# Patient Record
Sex: Female | Born: 1985 | Race: Black or African American | Hispanic: No | Marital: Single | Smoking: Former smoker
Health system: Southern US, Community
[De-identification: ages and names within clinical notes are randomized; demographics above are authoritative.]

## PROBLEM LIST (undated history)

## (undated) ENCOUNTER — Inpatient Hospital Stay (HOSPITAL_COMMUNITY): Payer: Self-pay

## (undated) DIAGNOSIS — Z789 Other specified health status: Secondary | ICD-10-CM

## (undated) HISTORY — PX: DILATION AND CURETTAGE OF UTERUS: SHX78

---
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
## 2007-02-01 ENCOUNTER — Ambulatory Visit (HOSPITAL_COMMUNITY): Admission: RE | Admit: 2007-02-01 | Discharge: 2007-02-01 | Payer: Self-pay | Admitting: Obstetrics & Gynecology

## 2007-02-15 ENCOUNTER — Ambulatory Visit (HOSPITAL_COMMUNITY): Admission: RE | Admit: 2007-02-15 | Discharge: 2007-02-15 | Payer: Self-pay | Admitting: Obstetrics & Gynecology

## 2007-07-14 ENCOUNTER — Inpatient Hospital Stay (HOSPITAL_COMMUNITY): Admission: AD | Admit: 2007-07-14 | Discharge: 2007-07-16 | Payer: Self-pay | Admitting: Obstetrics & Gynecology

## 2007-07-22 ENCOUNTER — Inpatient Hospital Stay (HOSPITAL_COMMUNITY): Admission: AD | Admit: 2007-07-22 | Discharge: 2007-07-25 | Payer: Self-pay | Admitting: Obstetrics & Gynecology

## 2008-08-04 ENCOUNTER — Emergency Department (HOSPITAL_COMMUNITY): Admission: EM | Admit: 2008-08-04 | Discharge: 2008-08-04 | Payer: Self-pay | Admitting: Family Medicine

## 2009-05-27 ENCOUNTER — Emergency Department (HOSPITAL_COMMUNITY): Admission: EM | Admit: 2009-05-27 | Discharge: 2009-05-27 | Payer: Self-pay | Admitting: Emergency Medicine

## 2010-08-28 ENCOUNTER — Encounter: Payer: Self-pay | Admitting: Obstetrics & Gynecology

## 2010-11-10 LAB — GC/CHLAMYDIA PROBE AMP, GENITAL: Chlamydia, DNA Probe: NEGATIVE

## 2010-11-10 LAB — URINALYSIS, ROUTINE W REFLEX MICROSCOPIC
Hgb urine dipstick: NEGATIVE
Nitrite: POSITIVE — AB
Protein, ur: NEGATIVE mg/dL
Specific Gravity, Urine: 1.035 — ABNORMAL HIGH (ref 1.005–1.030)
Urobilinogen, UA: 1 mg/dL (ref 0.0–1.0)

## 2010-11-10 LAB — DIFFERENTIAL
Basophils Absolute: 0 10*3/uL (ref 0.0–0.1)
Eosinophils Absolute: 0 10*3/uL (ref 0.0–0.7)
Eosinophils Relative: 1 % (ref 0–5)
Lymphocytes Relative: 33 % (ref 12–46)
Monocytes Absolute: 0.4 10*3/uL (ref 0.1–1.0)

## 2010-11-10 LAB — URINE CULTURE

## 2010-11-10 LAB — CBC
HCT: 40 % (ref 36.0–46.0)
Hemoglobin: 13.7 g/dL (ref 12.0–15.0)
MCHC: 34.2 g/dL (ref 30.0–36.0)
MCV: 95.3 fL (ref 78.0–100.0)
Platelets: 260 10*3/uL (ref 150–400)
RDW: 12.9 % (ref 11.5–15.5)

## 2010-11-10 LAB — HCG, QUANTITATIVE, PREGNANCY: hCG, Beta Chain, Quant, S: 67506 m[IU]/mL — ABNORMAL HIGH (ref <5)

## 2010-11-10 LAB — URINE MICROSCOPIC-ADD ON

## 2010-11-10 LAB — WET PREP, GENITAL
Trich, Wet Prep: NONE SEEN
Yeast Wet Prep HPF POC: NONE SEEN

## 2010-12-20 NOTE — H&P (Signed)
Mandy Robinson, Mandy Robinson NO.:  [id number]   MEDICAL RECORD NO.:  [id number]          PATIENT TYPE:  INP   LOCATION:  9110                          FACILITY:  [facility]   PHYSICIAN:  Roseanna Rainbow, M.D.DATE OF BIRTH:  09-06-1985   DATE OF ADMISSION:  07/14/2007  DATE OF DISCHARGE:                              HISTORY & PHYSICAL   CHIEF COMPLAINT:  The patient an 25 year old, G1, P0 with an estimated  date of confinement of December 6, with an intrauterine pregnancy of 40  plus weeks complaining of uterine contractions.   HISTORY OF PRESENT ILLNESS:  Please see the above.   OB RISK FACTORS:  Adolescent urinary tract infections.   ALLERGIES:  No known drug allergies.   PRENATAL LABORATORY DATA:  Quad screen negative.  Cystic fibrosis  negative.  GC probe negative.  Chlamydia probe negative.  Urine culture  and sensitivity on  Dec 20, 2006, no growth.  On May 5, greater than  100,000 E. coli, sickle cell negative, rubella immune, RPR nonreactive.  Blood type A positive, antibody screen negative.  Platelets 249,000.  HIV nonreactive.  Hemoglobin 13.2, hematocrit 38.7.  Varicella immune.  GBS negative on October 27.   PAST GYNECOLOGICAL HISTORY:  Noncontributory.   PAST MEDICAL HISTORY:  No significant history of medical diseases.   PAST SURGICAL HISTORY:  No previous surgery.   SOCIAL HISTORY:  She is single and does not give any significant history  of alcohol usage.  Stopped using cigarettes prior to pregnancy.  Denies  illicit drug use.   FAMILY HISTORY:  Breast cancer, hypertension, COPD, cataracts,  arthritis.   PHYSICAL EXAMINATION:  VITAL SIGNS:  Stable, afebrile.  ABDOMEN:  Fetal heart tracing reassuring.  Tocodynamometer uterine  contractions every 2-5 minutes.  PELVIC:  Sterile vaginal exam, per the RN, shows the cervix is 5-6 cm,  80% effaced with vertex at a -2 station.   ASSESSMENT:  Primigravida at term, early active labor.  Fetal heart  tracing consistent with fetal well-being.   PLAN:  Admission.  Expectant management.      Roseanna Rainbow, M.D.  Electronically Signed     LAJ/MEDQ  D:  07/14/2007  T:  07/15/2007  Job:  [id number]

## 2010-12-23 NOTE — Discharge Summary (Signed)
NAMEMarland Kitchen  Mandy Robinson, Mandy Robinson             ACCOUNT NO.:  [id number]   MEDICAL RECORD NO.:  [id number]          PATIENT TYPE:  INP   LOCATION:  9372                          FACILITY:  [facility]   PHYSICIAN:  Charles A. Clearance Coots, M.D.DATE OF BIRTH:  [birth year]   DATE OF ADMISSION:  07/22/2007  DATE OF DISCHARGE:  07/25/2007                               DISCHARGE SUMMARY   ADMITTING DIAGNOSIS:  Postpartum preeclampsia   DISCHARGE DIAGNOSIS:  Postpartum preeclampsia, resolved after medical  therapy and bedrest.   REASON FOR ADMISSION:  This 25 year old female  presented on postpartum  day #6, approximately, with elevated blood pressures that were taken at  home. The patient had an uncomplicated delivery.  Blood pressure on  admission was 173/114 and repeat blood pressure was 172/105. The patient  was admitted for postpartum preeclampsia.   PAST MEDICAL HISTORY:  Surgery, none.  Illnesses, none.   MEDICATIONS:  Prenatal vitamins.   ALLERGIES:  None   SOCIAL HISTORY:  Lives with mother.  Negative tobacco, alcohol or  recreational drug use.   FAMILY HISTORY:  Positive for cancer and hypertension.   PHYSICAL EXAMINATION:  GENERAL:  Well-nourished, well-developed female  in no acute distress.  VITAL SIGNS: Temperature 98.5, blood pressure 173/114, pulse 62,  respiratory rate 18.  LUNGS:  Lungs were clear to auscultation bilaterally.  HEART:  Regular rate and rhythm.  ABDOMEN: Gravid, nontender.  EXTREMITIES:  Extremities revealed deep tendon reflexes of 1-2+ with no  clonus.   LABORATORY DATA:  Admitting laboratory values -  Hemoglobin 13.8,  hematocrit 39.1, white blood cell count 6100, platelets 375,000.  Comprehensive metabolic panel was significant for ALT of 43 and AST of  14, uric acid was 7.6.   HOSPITAL COURSE:  The patient was admitted and started on IV magnesium  sulfate along with labetalol p.r.n. She responded well to therapy and by  hospital day #2 had stable blood pressures.  The patient was discharged  home on hospital day #3 with stable blood pressures on p.o. labetalol  with blood pressures to be followed up at home and in the office   DISCHARGE DISPOSITION:  Medications - Labetalol  100 mg p.o. twice a  day, Norvasc 5 mg p.o. daily. Continue prenatal vitamins.  Ibuprofen was  prescribed for pain. Routine written instructions were given for  preeclampsia precautions.  The patient is to call the office for a  follow-up appointment in 2 weeks.      Charles A. Clearance Coots, M.D.  Electronically Signed     CAH/MEDQ  D:  08/22/2007  T:  08/22/2007  Job:  [id number]

## 2011-05-12 LAB — COMPREHENSIVE METABOLIC PANEL
CO2: 24
Calcium: 8.9
Creatinine, Ser: 0.75
GFR calc Af Amer: >60
GFR calc non Af Amer: >60
Glucose, Bld: 86
Total Protein: 6.5

## 2011-05-12 LAB — CBC
Hemoglobin: 13.8
MCHC: 35.3
MCV: 95.7
RBC: 4.09
RDW: 12.5

## 2011-05-12 LAB — URINALYSIS, ROUTINE W REFLEX MICROSCOPIC
Nitrite: NEGATIVE
Protein, ur: NEGATIVE
Specific Gravity, Urine: <1.005 — ABNORMAL LOW
Urobilinogen, UA: 0.2

## 2011-05-12 LAB — LACTATE DEHYDROGENASE: LDH: 284 — ABNORMAL HIGH

## 2011-05-12 LAB — URINE MICROSCOPIC-ADD ON

## 2011-05-15 LAB — CBC
HCT: 37.8
Hemoglobin: 10.6 — ABNORMAL LOW
MCHC: 35.3
MCHC: 35.5
MCV: 96.1
Platelets: 195
Platelets: 230
RDW: 12.7
RDW: 13.1

## 2011-09-04 ENCOUNTER — Encounter (HOSPITAL_COMMUNITY): Payer: Self-pay | Admitting: *Deleted

## 2011-09-04 ENCOUNTER — Inpatient Hospital Stay (HOSPITAL_COMMUNITY)
Admission: AD | Admit: 2011-09-04 | Discharge: 2011-09-04 | Disposition: A | Discharge status: Home / Self Care | Payer: Medicaid Other | Source: Ambulatory Visit | Attending: Obstetrics & Gynecology | Admitting: Obstetrics & Gynecology

## 2011-09-04 DIAGNOSIS — O2390 Unspecified genitourinary tract infection in pregnancy, unspecified trimester: Secondary | ICD-10-CM | POA: Insufficient documentation

## 2011-09-04 DIAGNOSIS — Z3200 Encounter for pregnancy test, result unknown: Secondary | ICD-10-CM

## 2011-09-04 DIAGNOSIS — B9689 Other specified bacterial agents as the cause of diseases classified elsewhere: Secondary | ICD-10-CM | POA: Insufficient documentation

## 2011-09-04 DIAGNOSIS — N949 Unspecified condition associated with female genital organs and menstrual cycle: Secondary | ICD-10-CM | POA: Insufficient documentation

## 2011-09-04 DIAGNOSIS — N760 Acute vaginitis: Secondary | ICD-10-CM | POA: Insufficient documentation

## 2011-09-04 DIAGNOSIS — A499 Bacterial infection, unspecified: Secondary | ICD-10-CM | POA: Insufficient documentation

## 2011-09-04 HISTORY — DX: Other specified health status: Z78.9

## 2011-09-04 LAB — POCT PREGNANCY, URINE: Preg Test, Ur: POSITIVE — AB

## 2011-09-04 LAB — WET PREP, GENITAL

## 2011-09-04 MED ORDER — METRONIDAZOLE 500 MG PO TABS: 500.0000 mg | ORAL_TABLET | Freq: Two times a day (BID) | ORAL | Status: AC

## 2011-09-04 NOTE — Progress Notes (Signed)
Patient states she has had 3 positive home pregnancy tests over the past 2 weeks. Wants confirmation. Has a slight discharge, no pain or bleeding.

## 2011-09-04 NOTE — Progress Notes (Signed)
Pt in to confirm pregnancy.  Denies any pain or bleeding.  Reports a clear, odorous discharge that started after period.  LMP 08/06/11.  3 + UPT's at home

## 2011-09-04 NOTE — ED Provider Notes (Signed)
History     Chief Complaint  Patient presents with  . Possible Pregnancy   HPI This is a 26 y.o. at 4.1weeks presents requesting a pregnancy verification letter. ALso c/o vaginal discharge with odor. Has frequent episodes, but admits to douching OB History    Grav Para Term Preterm Abortions TAB SAB Ect Mult Living   3 1 1  0 1 1 0 0 0 1      Past Medical History  Diagnosis Date  . No pertinent past medical history     Past Surgical History  Procedure Date  . Dilation and curettage of uterus     History reviewed. No pertinent family history.  History  Substance Use Topics  . Smoking status: Current Everyday Smoker -- 0.1 packs/day    Types: Cigarettes  . Smokeless tobacco: Not on file  . Alcohol Use: No    Allergies: No Known Allergies  Prescriptions prior to admission  Medication Sig Dispense Refill  . hydrocortisone cream 1 % Apply 1 application topically 2 (two) times daily as needed. For eczema        ROS As above Physical Exam   Blood pressure 109/63, pulse 82, temperature 98.9 F (37.2 C), temperature source Oral, resp. rate 16, last menstrual period 08/06/2011, SpO2 98.00%.  Physical Exam  Constitutional: She is oriented to person, place, and time. She appears well-developed and well-nourished. No distress.  HENT:  Head: Normocephalic.  Cardiovascular: Normal rate.   Respiratory: Effort normal.  GI: Soft.  Genitourinary: Uterus normal. Vaginal discharge found.       Uterus small, nontender  Musculoskeletal: Normal range of motion.  Neurological: She is alert and oriented to person, place, and time.  Skin: Skin is warm and dry.  Psychiatric: She has a normal mood and affect.   Results for orders placed during the hospital encounter of 09/04/11 (from the past 24 hour(s))  POCT PREGNANCY, URINE     Status: Abnormal   Collection Time   09/04/11 10:02 AM      Component Value Range   Preg Test, Ur POSITIVE (*) NEGATIVE   WET PREP, GENITAL      Status: Abnormal   Collection Time   09/04/11 10:30 AM      Component Value Range   Yeast, Wet Prep NONE SEEN  NONE SEEN    Trich, Wet Prep NONE SEEN  NONE SEEN    Clue Cells, Wet Prep MODERATE (*) NONE SEEN    WBC, Wet Prep HPF POC FEW (*) NONE SEEN      MAU Course  Procedures  Assessment and Plan  A:  Preg at 4.1weeks      BV P:  Pregnancy verif. Letter given      Rx Flagyl      Start [facility]  [facility] 09/04/2011, 10:52 AM

## 2011-09-05 LAB — GC/CHLAMYDIA PROBE AMP, GENITAL: GC Probe Amp, Genital: NEGATIVE

## 2011-09-20 LAB — OB RESULTS CONSOLE RPR: RPR: NONREACTIVE

## 2011-10-02 LAB — OB RESULTS CONSOLE HEPATITIS B SURFACE ANTIGEN: Hepatitis B Surface Ag: NEGATIVE

## 2011-10-02 LAB — OB RESULTS CONSOLE HIV ANTIBODY (ROUTINE TESTING): HIV: NONREACTIVE

## 2012-04-19 ENCOUNTER — Encounter (HOSPITAL_COMMUNITY): Payer: Self-pay | Admitting: *Deleted

## 2012-04-19 ENCOUNTER — Inpatient Hospital Stay (HOSPITAL_COMMUNITY)
Admission: AD | Admit: 2012-04-19 | Discharge: 2012-04-21 | DRG: 775 | Disposition: A | Discharge status: Home / Self Care | Payer: Medicaid Other | Source: Ambulatory Visit | Attending: Obstetrics and Gynecology | Admitting: Obstetrics and Gynecology

## 2012-04-19 ENCOUNTER — Inpatient Hospital Stay (HOSPITAL_COMMUNITY): Payer: Medicaid Other | Admitting: Anesthesiology

## 2012-04-19 ENCOUNTER — Encounter (HOSPITAL_COMMUNITY): Payer: Self-pay | Admitting: Anesthesiology

## 2012-04-19 DIAGNOSIS — Z3480 Encounter for supervision of other normal pregnancy, unspecified trimester: Secondary | ICD-10-CM

## 2012-04-19 DIAGNOSIS — O99892 Other specified diseases and conditions complicating childbirth: Principal | ICD-10-CM | POA: Diagnosis present

## 2012-04-19 DIAGNOSIS — Z22330 Carrier of Group B streptococcus: Secondary | ICD-10-CM

## 2012-04-19 LAB — OB RESULTS CONSOLE GBS: GBS: POSITIVE

## 2012-04-19 LAB — CBC
Hemoglobin: 11 g/dL — ABNORMAL LOW (ref 12.0–15.0)
MCH: 30.7 pg (ref 26.0–34.0)
MCHC: 33.5 g/dL (ref 30.0–36.0)
MCV: 91.6 fL (ref 78.0–100.0)
RBC: 3.58 MIL/uL — ABNORMAL LOW (ref 3.87–5.11)

## 2012-04-19 LAB — RPR: RPR Ser Ql: NONREACTIVE

## 2012-04-19 MED ORDER — DIPHENHYDRAMINE HCL 50 MG/ML IJ SOLN: 12.5000 mg | INTRAMUSCULAR | Status: DC | PRN

## 2012-04-19 MED ORDER — OXYCODONE-ACETAMINOPHEN 5-325 MG PO TABS: 1.0000 | ORAL_TABLET | ORAL | Status: DC | PRN

## 2012-04-19 MED ORDER — LIDOCAINE HCL (PF) 1 % IJ SOLN: 30.0000 mL | INTRAMUSCULAR | Status: DC | PRN

## 2012-04-19 MED ORDER — ACETAMINOPHEN 325 MG PO TABS: 650.0000 mg | ORAL_TABLET | ORAL | Status: DC | PRN

## 2012-04-19 MED ORDER — BENZOCAINE-MENTHOL 20-0.5 % EX AERO: 1.0000 "application " | INHALATION_SPRAY | CUTANEOUS | Status: DC | PRN

## 2012-04-19 MED ORDER — TERBUTALINE SULFATE 1 MG/ML IJ SOLN: 0.2500 mg | Freq: Once | INTRAMUSCULAR | Status: DC | PRN

## 2012-04-19 MED ORDER — LACTATED RINGERS IV SOLN
INTRAVENOUS | Status: DC
  Administered 2012-04-19: 16:00:00 via INTRAVENOUS

## 2012-04-19 MED ORDER — DIPHENHYDRAMINE HCL 25 MG PO CAPS: 25.0000 mg | ORAL_CAPSULE | Freq: Four times a day (QID) | ORAL | Status: DC | PRN

## 2012-04-19 MED ORDER — PHENYLEPHRINE 40 MCG/ML (10ML) SYRINGE FOR IV PUSH (FOR BLOOD PRESSURE SUPPORT): 80.0000 ug | PREFILLED_SYRINGE | INTRAVENOUS | Status: DC | PRN

## 2012-04-19 MED ORDER — ONDANSETRON HCL 4 MG PO TABS: 4.0000 mg | ORAL_TABLET | ORAL | Status: DC | PRN

## 2012-04-19 MED ORDER — OXYTOCIN BOLUS FROM INFUSION
500.0000 mL | Freq: Once | INTRAVENOUS | Status: AC
  Administered 2012-04-19: 500 mL via INTRAVENOUS
  Filled 2012-04-19: qty 500

## 2012-04-19 MED ORDER — OXYTOCIN 40 UNITS IN LACTATED RINGERS INFUSION - SIMPLE MED
1.0000 m[IU]/min | INTRAVENOUS | Status: DC
  Administered 2012-04-19: 4 m[IU]/min via INTRAVENOUS
  Administered 2012-04-19: 2 m[IU]/min via INTRAVENOUS

## 2012-04-19 MED ORDER — DIBUCAINE 1 % RE OINT: 1.0000 "application " | TOPICAL_OINTMENT | RECTAL | Status: DC | PRN

## 2012-04-19 MED ORDER — CITRIC ACID-SODIUM CITRATE 334-500 MG/5ML PO SOLN: 30.0000 mL | ORAL | Status: DC | PRN

## 2012-04-19 MED ORDER — TETANUS-DIPHTH-ACELL PERTUSSIS 5-2.5-18.5 LF-MCG/0.5 IM SUSP
0.5000 mL | Freq: Once | INTRAMUSCULAR | Status: AC
  Administered 2012-04-20: 0.5 mL via INTRAMUSCULAR
  Filled 2012-04-19: qty 0.5

## 2012-04-19 MED ORDER — FENTANYL 2.5 MCG/ML BUPIVACAINE 1/10 % EPIDURAL INFUSION (WH - ANES)
INTRAMUSCULAR | Status: DC | PRN
  Administered 2012-04-19: 14 mL/h via EPIDURAL

## 2012-04-19 MED ORDER — WITCH HAZEL-GLYCERIN EX PADS: 1.0000 "application " | MEDICATED_PAD | CUTANEOUS | Status: DC | PRN

## 2012-04-19 MED ORDER — SIMETHICONE 80 MG PO CHEW: 80.0000 mg | CHEWABLE_TABLET | ORAL | Status: DC | PRN

## 2012-04-19 MED ORDER — SENNOSIDES-DOCUSATE SODIUM 8.6-50 MG PO TABS
2.0000 | ORAL_TABLET | Freq: Every day | ORAL | Status: DC
  Administered 2012-04-20: 2 via ORAL

## 2012-04-19 MED ORDER — FENTANYL 2.5 MCG/ML BUPIVACAINE 1/10 % EPIDURAL INFUSION (WH - ANES)
14.0000 mL/h | INTRAMUSCULAR | Status: DC
  Administered 2012-04-19: 14 mL/h via EPIDURAL
  Filled 2012-04-19: qty 60

## 2012-04-19 MED ORDER — PRENATAL MULTIVITAMIN CH
1.0000 | ORAL_TABLET | Freq: Every day | ORAL | Status: DC
  Administered 2012-04-20 – 2012-04-21 (×2): 1 via ORAL
  Filled 2012-04-19 (×2): qty 1

## 2012-04-19 MED ORDER — LACTATED RINGERS IV SOLN: 500.0000 mL | Freq: Once | INTRAVENOUS | Status: DC

## 2012-04-19 MED ORDER — ONDANSETRON HCL 4 MG/2ML IJ SOLN: 4.0000 mg | Freq: Four times a day (QID) | INTRAMUSCULAR | Status: DC | PRN

## 2012-04-19 MED ORDER — IBUPROFEN 600 MG PO TABS
600.0000 mg | ORAL_TABLET | Freq: Four times a day (QID) | ORAL | Status: DC | PRN
  Administered 2012-04-19: 600 mg via ORAL
  Filled 2012-04-19: qty 1

## 2012-04-19 MED ORDER — EPHEDRINE 5 MG/ML INJ
10.0000 mg | INTRAVENOUS | Status: DC | PRN
  Filled 2012-04-19: qty 4

## 2012-04-19 MED ORDER — PENICILLIN G POTASSIUM 5000000 UNITS IJ SOLR
5.0000 10*6.[IU] | Freq: Once | INTRAVENOUS | Status: AC
  Administered 2012-04-19: 5 10*6.[IU] via INTRAVENOUS
  Filled 2012-04-19: qty 5

## 2012-04-19 MED ORDER — PENICILLIN G POTASSIUM 5000000 UNITS IJ SOLR
2.5000 10*6.[IU] | INTRAVENOUS | Status: DC
  Administered 2012-04-19: 2.5 10*6.[IU] via INTRAVENOUS
  Filled 2012-04-19 (×4): qty 2.5

## 2012-04-19 MED ORDER — OXYTOCIN 40 UNITS IN LACTATED RINGERS INFUSION - SIMPLE MED
62.5000 mL/h | Freq: Once | INTRAVENOUS | Status: AC
  Administered 2012-04-19: 62.5 mL/h via INTRAVENOUS
  Filled 2012-04-19: qty 1000

## 2012-04-19 MED ORDER — IBUPROFEN 600 MG PO TABS
600.0000 mg | ORAL_TABLET | Freq: Four times a day (QID) | ORAL | Status: DC
  Administered 2012-04-20 – 2012-04-21 (×5): 600 mg via ORAL
  Filled 2012-04-19 (×5): qty 1

## 2012-04-19 MED ORDER — LANOLIN HYDROUS EX OINT: TOPICAL_OINTMENT | CUTANEOUS | Status: DC | PRN

## 2012-04-19 MED ORDER — PHENYLEPHRINE 40 MCG/ML (10ML) SYRINGE FOR IV PUSH (FOR BLOOD PRESSURE SUPPORT)
80.0000 ug | PREFILLED_SYRINGE | INTRAVENOUS | Status: DC | PRN
  Filled 2012-04-19: qty 5

## 2012-04-19 MED ORDER — LIDOCAINE HCL (PF) 1 % IJ SOLN
INTRAMUSCULAR | Status: DC | PRN
  Administered 2012-04-19 (×2): 9 mL

## 2012-04-19 MED ORDER — EPHEDRINE 5 MG/ML INJ: 10.0000 mg | INTRAVENOUS | Status: DC | PRN

## 2012-04-19 MED ORDER — LACTATED RINGERS IV SOLN
500.0000 mL | INTRAVENOUS | Status: DC | PRN
  Administered 2012-04-19: 500 mL via INTRAVENOUS

## 2012-04-19 MED ORDER — ONDANSETRON HCL 4 MG/2ML IJ SOLN: 4.0000 mg | INTRAMUSCULAR | Status: DC | PRN

## 2012-04-19 MED ORDER — ZOLPIDEM TARTRATE 5 MG PO TABS: 5.0000 mg | ORAL_TABLET | Freq: Every evening | ORAL | Status: DC | PRN

## 2012-04-19 NOTE — Anesthesia Procedure Notes (Signed)
Epidural Patient location during procedure: OB Start time: 04/19/2012 4:15 PM End time: 04/19/2012 4:19 PM  Staffing Anesthesiologist: Sandrea Hughs Performed by: anesthesiologist   Preanesthetic Checklist Completed: patient identified, site marked, surgical consent, pre-op evaluation, timeout performed, IV checked, risks and benefits discussed and monitors and equipment checked  Epidural Patient position: sitting Prep: site prepped and draped and DuraPrep Patient monitoring: continuous pulse ox and blood pressure Approach: midline Injection technique: LOR air  Needle:  Needle type: Tuohy  Needle gauge: 17 G Needle length: 9 cm and 9 Needle insertion depth: 5 cm cm Catheter type: closed end flexible Catheter size: 19 Gauge Catheter at skin depth: 10 cm Test dose: negative and Other  Assessment Sensory level: T8 Events: blood not aspirated, injection not painful, no injection resistance, negative IV test and no paresthesia  Additional Notes Reason for block:procedure for pain

## 2012-04-19 NOTE — H&P (Signed)
Pt is a 26 year old black female, G3P1011 at [redacted] weeks EGA who presents to L&D c/o SROM. PNC was uncomplicated. She does have an abnormal PAP. +GBS. PMHx: see [facility] PE: VSSAF        HEENT-wnl        Abd- gravid, non tender        Cx50/2-3/-2 vtx. IMP/ IUP at term         SROM         +GBS PLAN/ Admit             Start Pit             Start PCN.

## 2012-04-19 NOTE — Anesthesia Preprocedure Evaluation (Signed)
Anesthesia Evaluation  Patient identified by MRN, date of birth, ID band Patient awake    Reviewed: Allergy & Precautions, H&P , Patient's Chart, lab work & pertinent test results  Airway Mallampati: I TM Distance: >3 FB Neck ROM: full    Dental No notable dental hx.    Pulmonary neg pulmonary ROS,    Pulmonary exam normal       Cardiovascular negative cardio ROS      Neuro/Psych negative neurological ROS  negative psych ROS   GI/Hepatic negative GI ROS, Neg liver ROS,   Endo/Other  negative endocrine ROS  Renal/GU negative Renal ROS     Musculoskeletal   Abdominal Normal abdominal exam  (+)   Peds negative pediatric ROS (+)  Hematology negative hematology ROS (+)   Anesthesia Other Findings   Reproductive/Obstetrics (+) Pregnancy                           Anesthesia Physical Anesthesia Plan  ASA: II  Anesthesia Plan: Epidural   Post-op Pain Management:    Induction:   Airway Management Planned:   Additional Equipment:   Intra-op Plan:   Post-operative Plan:   Informed Consent: I have reviewed the patients History and Physical, chart, labs and discussed the procedure including the risks, benefits and alternatives for the proposed anesthesia with the patient or authorized representative who has indicated his/her understanding and acceptance.     Plan Discussed with:   Anesthesia Plan Comments:         Anesthesia Quick Evaluation

## 2012-04-20 ENCOUNTER — Encounter (HOSPITAL_COMMUNITY): Payer: Self-pay | Admitting: *Deleted

## 2012-04-20 LAB — CBC
Hemoglobin: 11.4 g/dL — ABNORMAL LOW (ref 12.0–15.0)
MCH: 30.6 pg (ref 26.0–34.0)
MCV: 91.7 fL (ref 78.0–100.0)
RBC: 3.72 MIL/uL — ABNORMAL LOW (ref 3.87–5.11)
WBC: 12.4 10*3/uL — ABNORMAL HIGH (ref 4.0–10.5)

## 2012-04-20 MED ORDER — INFLUENZA VIRUS VACC SPLIT PF IM SUSP
0.5000 mL | INTRAMUSCULAR | Status: AC
  Administered 2012-04-20: 0.5 mL via INTRAMUSCULAR
  Filled 2012-04-20: qty 0.5

## 2012-04-20 NOTE — Progress Notes (Signed)
PPD#1 Pt without c/o. Mother and baby are doing well. VSSAF Lochia-mild IMP/ stable Plan/ routine care.

## 2012-04-20 NOTE — Anesthesia Postprocedure Evaluation (Signed)
  Anesthesia Post-op Note  Patient: Mandy Robinson  Procedure(s) Performed: * No procedures listed *  Patient Location: Mother/Baby  Anesthesia Type: Epidural  Level of Consciousness: awake, alert  and oriented  Airway and Oxygen Therapy: Patient Spontanous Breathing  Post-op Pain: mild  Post-op Assessment: Patient's Cardiovascular Status Stable and Respiratory Function Stable  Post-op Vital Signs: stable  Complications: No apparent anesthesia complications

## 2012-04-21 MED ORDER — MEDROXYPROGESTERONE ACETATE 150 MG/ML IM SUSP
150.0000 mg | Freq: Once | INTRAMUSCULAR | Status: AC
  Administered 2012-04-21: 150 mg via INTRAMUSCULAR
  Filled 2012-04-21: qty 1

## 2012-04-21 MED ORDER — OXYCODONE-ACETAMINOPHEN 5-325 MG PO TABS: 1.0000 | ORAL_TABLET | ORAL | Status: AC | PRN

## 2012-04-21 NOTE — Progress Notes (Signed)
PPD#2 Pt without c/o. Ready for discharge. Plan/ Will discharge

## 2012-04-21 NOTE — Discharge Summary (Signed)
Obstetric Discharge Summary Reason for Admission: onset of labor Prenatal Procedures: ultrasound Intrapartum Procedures: spontaneous vaginal delivery Postpartum Procedures: none Complications-Operative and Postpartum: none Hemoglobin  Date Value Range Status  04/20/2012 11.4* 12.0 - 15.0 g/dL Final     HCT  Date Value Range Status  04/20/2012 34.1* 36.0 - 46.0 % Final    Physical Exam:  General: alert Lochia: appropriate Uterine Fundus: firm   Discharge Diagnoses: Term Pregnancy-delivered  Discharge Information: Date: 04/21/2012 Activity: pelvic rest Diet: routine Medications: PNV, Ibuprofen and Percocet Condition: stable Instructions: refer to practice specific booklet Discharge to: home Follow-up Information    Follow up with Levi Aland, MD. Schedule an appointment as soon as possible for a visit in 4 weeks.   Contact information:   [address] [place] [address] [phone]          Newborn Data: Live born female  Birth Weight: 6 lb 6.1 oz (2895 g) APGAR: 9, 9  Home with mother.  ANDERSON,MARK E 04/21/2012, 10:17 AM

## 2012-04-22 NOTE — Progress Notes (Signed)
Post discharge chart review completed.  

## 2014-06-08 ENCOUNTER — Encounter (HOSPITAL_COMMUNITY): Payer: Self-pay | Admitting: *Deleted

## 2014-12-01 ENCOUNTER — Emergency Department (HOSPITAL_COMMUNITY)
Admission: EM | Admit: 2014-12-01 | Discharge: 2014-12-01 | Disposition: A | Discharge status: Home / Self Care | Payer: Medicaid Other | Attending: Emergency Medicine | Admitting: Emergency Medicine

## 2014-12-01 ENCOUNTER — Encounter (HOSPITAL_COMMUNITY): Payer: Self-pay

## 2014-12-01 DIAGNOSIS — Z87891 Personal history of nicotine dependence: Secondary | ICD-10-CM | POA: Insufficient documentation

## 2014-12-01 DIAGNOSIS — L720 Epidermal cyst: Secondary | ICD-10-CM | POA: Insufficient documentation

## 2014-12-01 NOTE — ED Notes (Signed)
Pt reports she noticed a small cyst on her neck this morning that is painful to palpation. No discharge or drainage

## 2014-12-01 NOTE — ED Notes (Signed)
Pt went to bed and woke up this morning with a small knot to her anterior neck that is a little sore and was worried when it didn't go away.

## 2014-12-01 NOTE — Discharge Instructions (Signed)
Your cyst is likely an epidermal cyst or a reactive lymph node.  It is usually not harmful.  Follow up with a primary care provider if you have further concern.  Epidermal Cyst An epidermal cyst is usually a small, painless lump under the skin. Cysts often occur on the face, neck, stomach, chest, or genitals. The cyst may be filled with a bad smelling paste. Do not pop your cyst. Popping the cyst can cause pain and puffiness (swelling). HOME CARE   Only take medicines as told by your doctor.  Take your medicine (antibiotics) as told. Finish it even if you start to feel better. GET HELP RIGHT AWAY IF:  Your cyst is tender, red, or puffy.  You are not getting better, or you are getting worse.  You have any questions or concerns. MAKE SURE YOU:  Understand these instructions.  Will watch your condition.  Will get help right away if you are not doing well or get worse. Document Released: 08/31/2004 Document Revised: 01/23/2012 Document Reviewed: 01/30/2011 [facility] Patient Information 2015 [place], [place]. This information is not intended to replace advice given to you by your health care provider. Make sure you discuss any questions you have with your health care provider.   Emergency Department Resource Guide 1) Find a Doctor and Pay Out of Pocket Although you won't have to find out who is covered by your insurance plan, it is a good idea to ask around and get recommendations. You will then need to call the office and see if the doctor you have chosen will accept you as a new patient and what types of options they offer for patients who are self-pay. Some doctors offer discounts or will set up payment plans for their patients who do not have insurance, but you will need to ask so you aren't surprised when you get to your appointment.  2) Contact Your Local Health Department Not all health departments have doctors that can see patients for sick visits, but many do, so it is worth a call  to see if yours does. If you don't know where your local health department is, you can check in your phone book. The CDC also has a tool to help you locate your state's health department, and many state websites also have listings of all of their local health departments.  3) Find a Walk-in Clinic If your illness is not likely to be very severe or complicated, you may want to try a walk in clinic. These are popping up all over the country in pharmacies, drugstores, and shopping centers. They're usually staffed by nurse practitioners or physician assistants that have been trained to treat common illnesses and complaints. They're usually fairly quick and inexpensive. However, if you have serious medical issues or chronic medical problems, these are probably not your best option.  No Primary Care Doctor: - Call Health Connect at  [phone] - they can help you locate a primary care doctor that  accepts your insurance, provides certain services, etc. - Physician Referral Service- [phone]  Chronic Pain Problems: Organization         Address  Phone   Notes  [facility]  [phone] Patients need to be referred by their primary care doctor.   Medication Assistance: Organization         Address  Phone   Notes  [facility] Med[facility] [address] [place] [place], [place] [address] [phone] --Must be a resident of [address]  [place] -- Must have NO insurance coverage whatsoever (no Medicaid/ Medicare, etc.) -- The pt. MUST have a primary care doctor that directs their care regularly and follows them in the community   MedAssist  [phone]) [phone]   [employer]  [phone]) [phone]    Agencies that provide inexpensive medical care: Organization         Address  Phone   Notes  [facility]  [phone](336) [phone]   [facility]    [phone]) [phone]   [facility] [address] [place], [place] [address] [phone])  [phone]   [facility] [address], [place] [phone](336) [phone]   [facility]anned [facility]    [phone](336) [phone]   [facility]    [phone](336) [phone]   [facility]  [address], [place] Phone:  [phone]) [phone], Fax:  [phone] Hours of Operation:  9 am - 6 pm, M-F.  Also accepts Medicaid/Medicare and self-pay.  [facility]  [address], [place] Phone: [phone](336) [phone], Fax: [phone](336) [phone]. Hours of Operation:  8:30 am - 5:30 pm, M-F.  Also accepts Medicaid and self-pay.  [facility] [place] [address], [place] Phone: [phone](336) [phone]   [facility] [address], [place], [place] [phone])[phone], Ext. [phone] Mondays & Thursdays: 7-9 AM.  First 15 patients are seen on a first come, first serve basis.    Medicaid-accepting [facility] Providers:  Organization         Address  Phone   Notes  [facility] [address], [place] [phone]) [phone] Also accepts self-pay patients.  [facility] [address], [place]  [phone]) [phone]   [facility] [address], [place] [phone](336) [phone]   [facility] [address] [place] [address], [place] [phone](336) [phone]   Renaye RakersVeita Bland [address], [place]   [phone]) [phone] Only accepts [place] Access [place] patients after they have their name applied to their card.   Self-Pay (no insurance) in [facility] [place]:  Organization         Address  Phone   Notes  Sickle Cell Patients, [facility] [address], [place] [phone]) [phone]   [facility] [address], [place] [phone]) [phone]   [facility] [place]  [address], [place] [phone](336) [phone]   [facility]/Dr. Osei-Bonsu  [address] [place] [address], [place] or [phone], [place] [phone](336) [phone] Phone number for both [place] and [place] locations is the same.  [facility] [address], [place] [phone](336) [phone]   [facility]  [address] [place] [address], [place] or [address] [phone](336) [phone]   [facility] [address], [place] [phone](336) [phone], phone; [phone](336) [phone], fax Sees patients 1st and 3rd Saturday of every month.  Must not qualify for public or private insurance (i.e. Medicaid, Medicare, [place] Health Choice, Veterans' Benefits)  Household income should be no more than 200% of the poverty level The clinic cannot treat you if you are pregnant or think you are pregnant  Sexually transmitted diseases are not treated at the clinic.    Dental Care: Organization         Address  Phone  Notes  [facility] [address] [place], [place] [phone] Accepts children up to age 39 who are enrolled in [place] or [place] Health Choice; pregnant women with a Medicaid card; and children who have applied for Medicaid or [place] Health Choice, but were declined, whose parents can pay a reduced fee at time of service.  [facility]  [address], [place] [phone] Accepts children up to age 6 who are enrolled in [place] or [place] Health Choice; pregnant women with a Medicaid card; and children who have applied for Medicaid or [place] Health Choice, but were declined, whose parents can pay a reduced fee at time of service.  [facility]  [address] [place], [place] [phone] Patients are seen by appointment only. Walk-ins are not accepted. [facility] will see patients 9 years of age and older. Monday - Tuesday (8am-5pm) Most Wednesdays (8:30-5pm) $30 per visit, cash only  [facility] Adult Dental Access PROGRAM  [address], [facility] [phone] Patients are  seen by appointment only. Walk-ins are not accepted. [facility] will see patients 49 years of age and older. One Wednesday Evening (Monthly: Volunteer Based).  $30 per visit, cash only  [employer]  [phone] for adults; Children under age 65, call Graduate Pediatric Dentistry at [phone]. Children aged 37-14, please call [phone] to request a pediatric application.  Dental services are provided in all areas of dental care including fillings, crowns and bridges, complete and partial dentures, implants, gum treatment, root canals, and extractions. Preventive care is also provided. Treatment is provided to both adults and children. Patients are selected via a lottery and there is often a waiting list.   [facility] [address], [place]  [phone] [email or web address]   [facility] [address] [place], [place] [phone], Ext. [phone] Second and Fourth Thursday of each month, opens at 6:30 AM; Clinic ends at 9 AM.  Patients are seen on a first-come first-served basis, and a limited number are seen during each clinic.   [facility]  [address] [place], [place] [phone]   Eligibility Requirements You must have lived in [place], [place], or [place] counties for at least the last three months.   You cannot be eligible for state or federal sponsored [place], including [employer], [place], or [employer].   You generally cannot be eligible for healthcare insurance through your employer.    How to apply: Eligibility screenings are held every Tuesday and Wednesday afternoon from 1:00 pm until 4:00 pm. You do not need an appointment for the interview!  [facility] [address] [place], [place] [phone]   [facility] Health Department  [phone]   [facility] Health Department  [phone]   [facility] Health Department  [phone]     Behavioral Health Resources in the Community: Intensive Outpatient Programs Organization         Address  Phone  Notes  [facility] Services [address], [place], [place] [phone]   [facility] Outpatient [address], [place], [place] [phone]   [facility] [address] [place], [place]  [phone]   [facility] Mental Health [address],  [place], [place] [phone] or [phone]   Substance Abuse Resources Organization  Address  Phone  Notes  [facility] Drug Services  [phone]   [facility]  [phone]   [facility]  [phone]   Floydene Flock  [phone]   Residential & Outpatient Substance Abuse Program  [phone]   Psychological Services Organization         Address  Phone  Notes  [facility]  [phone]   [facility]  [phone]   [facility] [address] [place] [phone] or [phone]    Mobile Crisis Teams Organization         Address  Phone  Notes  [facility], Mobile Crisis Care Unit  [phone]   [facility]  [address] [place], [place] [phone]   Doristine Locks [address] [place] [phone]    Self-Help/Support Groups Organization         Address  Phone             Notes  [facility] [place] - variety of support groups  336- I7437963 Call for more information  Narcotics Anonymous (NA), [facility] [address], [employer] [place]  2 meetings at this location   Statistician         Address  Phone  Notes  [facility] [address],    [place]  [phone]   [facility]  [address], [place] [address], [place], [place] [phone]   [facility] [address] [place], [place] [phone] Admissions: 8am-3pm M-F  Incentives  [facility] Treatment Center [address]    [place], [place] [phone]   [facility] [address] [place], [place], [place] [phone]   [facility] [address],  [place], [place] [phone]   [facility] - Intensive Outpatient [address], [place], [place] [phone]   [facility] (Addiction Recovery Care [facility] [address]  [place], [place] [phone] or [phone]   [facility] (RT[facility]) [address] [place], [place] [phone] Accepts Medicaid  [facility] [place] [address]  [place] [phone] Substance Abuse/Addiction Treatment   [facility] Organization         Address  Phone  Notes  [facility]  [phone]   Angie Fava, PhD [address] [place], [place]   [phone] or [phone]   [facility]   [address] [place], [place] [phone]   [facility] [address], [place], [place] [phone] Insurance/Medicaid/sponsorship through [facility] and Families [address]                                    [place], [place] [phone] Therapy/tele-psych/case  [facility] [address], [place] [phone]    Dr. Lolly Mustache  [phone]   [facility] [place]  [employer] [facility] Dept. 1) [address] [place] 2) [address] [place] 3)  [address], [place] [phone]  [phone]   [facility] Child Abuse Hotline (351)568-2190 or [phone] (After Hours)

## 2014-12-01 NOTE — ED Provider Notes (Signed)
CSN: [id number]     Arrival date & time 12/01/14  1739 History  This chart was scribed for non-physician practitioner, Fayrene Helper, PA-C working with Richardean Canal, MD by Gwenyth Ober, ED scribe. This patient was seen in room TR05C/TR05C and the patient's care was started at 6:21 PM   Chief Complaint  Patient presents with  . Cyst   The history is provided by the patient. No language interpreter was used.    HPI Comments: Mandy Robinson is a 29 y.o. female who presents to the Emergency Department complaining of a 1 cm cyst, with associated mild pain, on her anterior neck that appeared this morning. She has not tried any treatment PTA. Pt denies new environmental factors. She also denies rhinorrhea, fever, cough, CP, SOB, chills and abdominal pain as associated symptoms.   Past Medical History  Diagnosis Date  . No pertinent past medical history    Past Surgical History  Procedure Laterality Date  . Dilation and curettage of uterus     No family history on file. History  Substance Use Topics  . Smoking status: Former Smoker -- 0.25 packs/day    Types: Cigarettes  . Smokeless tobacco: Not on file  . Alcohol Use: No   OB History    Gravida Para Term Preterm AB TAB SAB Ectopic Multiple Living   0 0 0 2     Review of Systems  Constitutional: Negative for fever and chills.  HENT: Negative for rhinorrhea.   Respiratory: Negative for cough and shortness of breath.   Cardiovascular: Negative for chest pain.      Allergies  Review of patient's allergies indicates no known allergies.  Home Medications   Prior to Admission medications   Not on File   BP 118/78 mmHg  Pulse 70  Temp(Src) 98.8 F (37.1 C) (Oral)  Resp 12  Ht  (1.727 m)  Wt 130 lb (58.968 kg)  BMI 19.77 kg/m2  SpO2 98%  LMP 11/16/2014 Physical Exam  Constitutional: She appears well-developed and well-nourished. No distress.  HENT:  Head: Normocephalic and atraumatic.  Right Ear:  External ear normal.  Left Ear: External ear normal.  Nose: Nose normal.  Mouth/Throat: Oropharynx is clear and moist. No oropharyngeal exudate.  Eyes: Conjunctivae and EOM are normal.  Neck: Neck supple. No tracheal deviation present.  Less than 1 cm mobile subcutaneous nodule noted to anterior neck adjacent to left clavicle that is mildly tender, no surrounding erythema, no fluctuance  Cardiovascular: Normal rate, regular rhythm and normal heart sounds.   Pulmonary/Chest: Effort normal and breath sounds normal. No respiratory distress.  Skin: Skin is warm and dry.  Psychiatric: She has a normal mood and affect. Her behavior is normal.  Nursing note and vitals reviewed.   ED Course  Procedures   DIAGNOSTIC STUDIES: Oxygen Saturation is 98% on RA, normal by my interpretation.    COORDINATION OF CARE: 6:25 PM Possible reactive lymph node but likely an epidermal cyst. No signs of infection. Discussed treatment plan with pt which includes Ibuprofen as needed. Pt agreed to plan.   Labs Review Labs Reviewed - No data to display  Imaging Review No results found.   EKG Interpretation None      MDM   Final diagnoses:  Epidermal cyst of neck   BP 118/78 mmHg  Pulse 70  Temp(Src) 98.8 F (37.1 C) (Oral)  Resp 12  Ht  (1.727 m)  Wt 130 lb (  58.968 kg)  BMI 19.77 kg/m2  SpO2 98%  LMP 11/16/2014  I personally performed the services described in this documentation, which was scribed in my presence. The recorded information has been reviewed and is accurate.     Fayrene HelperBowie Adyen Bifulco, PA-C 12/01/14 1856  Richardean Canalavid H Yao, MD 12/01/14 [phone]

## 2017-05-23 ENCOUNTER — Emergency Department (HOSPITAL_COMMUNITY): Payer: Self-pay

## 2017-05-23 ENCOUNTER — Encounter (HOSPITAL_COMMUNITY): Payer: Self-pay | Admitting: Emergency Medicine

## 2017-05-23 ENCOUNTER — Emergency Department (HOSPITAL_COMMUNITY)
Admission: EM | Admit: 2017-05-23 | Discharge: 2017-05-23 | Disposition: A | Discharge status: Home / Self Care | Payer: Self-pay | Attending: Physician Assistant | Admitting: Physician Assistant

## 2017-05-23 DIAGNOSIS — X19XXXA Contact with other heat and hot substances, initial encounter: Secondary | ICD-10-CM | POA: Insufficient documentation

## 2017-05-23 DIAGNOSIS — Y929 Unspecified place or not applicable: Secondary | ICD-10-CM | POA: Insufficient documentation

## 2017-05-23 DIAGNOSIS — Y999 Unspecified external cause status: Secondary | ICD-10-CM | POA: Insufficient documentation

## 2017-05-23 DIAGNOSIS — Z87891 Personal history of nicotine dependence: Secondary | ICD-10-CM | POA: Insufficient documentation

## 2017-05-23 DIAGNOSIS — R2231 Localized swelling, mass and lump, right upper limb: Secondary | ICD-10-CM | POA: Insufficient documentation

## 2017-05-23 DIAGNOSIS — R609 Edema, unspecified: Secondary | ICD-10-CM

## 2017-05-23 DIAGNOSIS — I889 Nonspecific lymphadenitis, unspecified: Secondary | ICD-10-CM | POA: Insufficient documentation

## 2017-05-23 DIAGNOSIS — Y939 Activity, unspecified: Secondary | ICD-10-CM | POA: Insufficient documentation

## 2017-05-23 NOTE — Discharge Instructions (Signed)
Return as needed if not better in several weeks.
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
## 2018-04-15 IMAGING — US US EXTREM UP *R* LTD
1 series · 14 of 22 positions shown · non-contrast
Comparison: None

CLINICAL DATA: Right arm swelling.  Pain.

EXAM:
ULTRASOUND RIGHT UPPER EXTREMITY LIMITED
TECHNIQUE: Ultrasound examination of the upper extremity soft tissues was
performed in the area of clinical concern.

[Series 1: us extrem up *right* ltd · 0.07mm/px · 22 acquisitions, 14 frames shown]
[im 1/22]
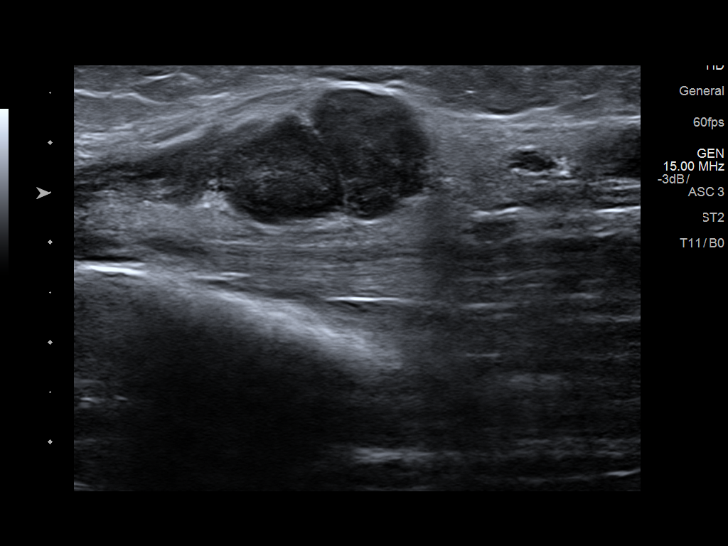
[im 3/22]
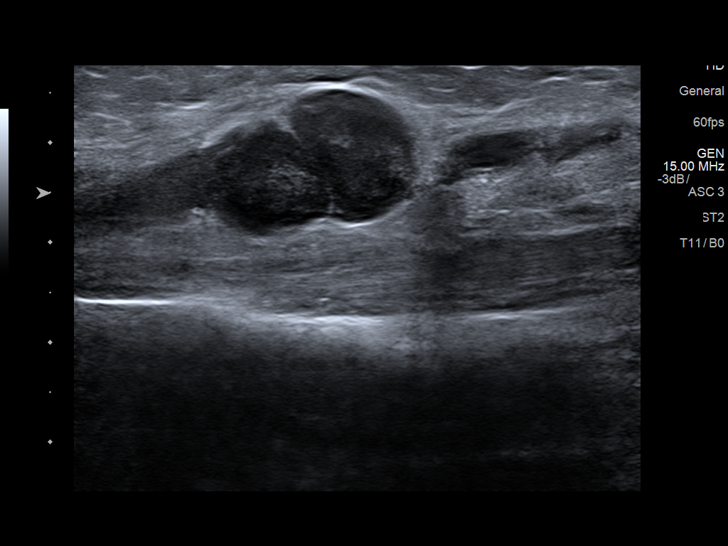
[im 4/22]
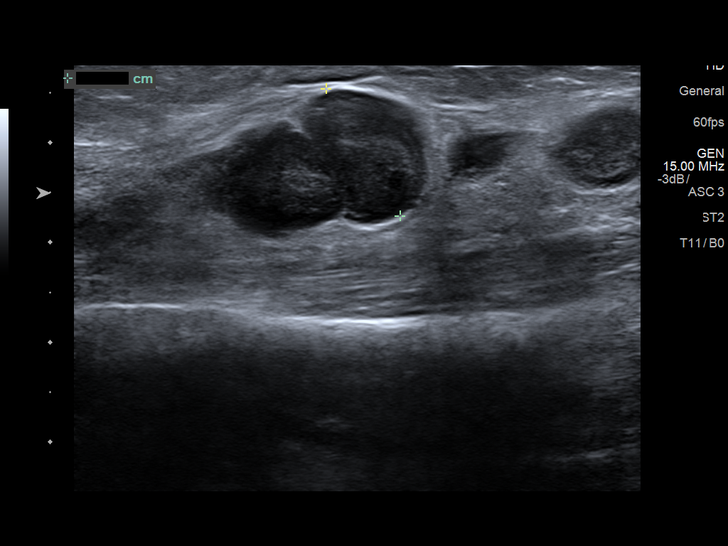
[im 6/22]
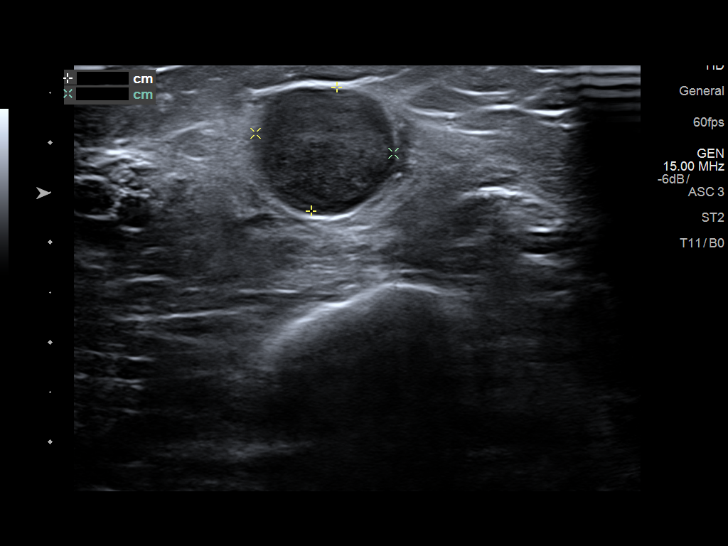
[im 8/22]
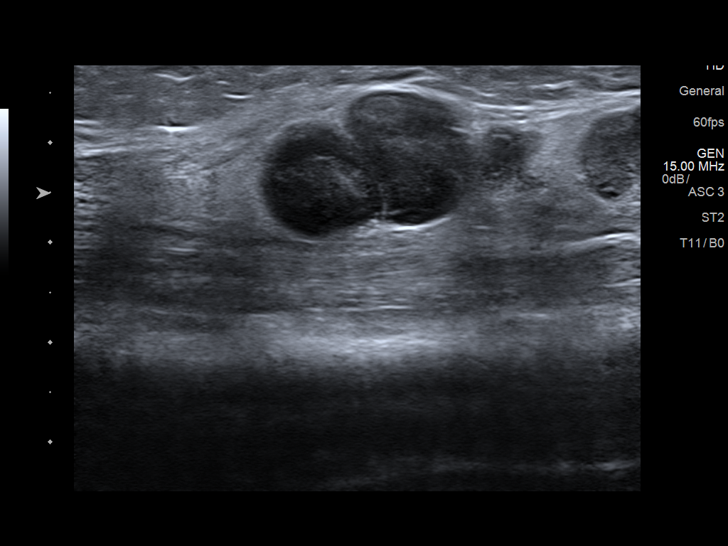
[im 9/22]
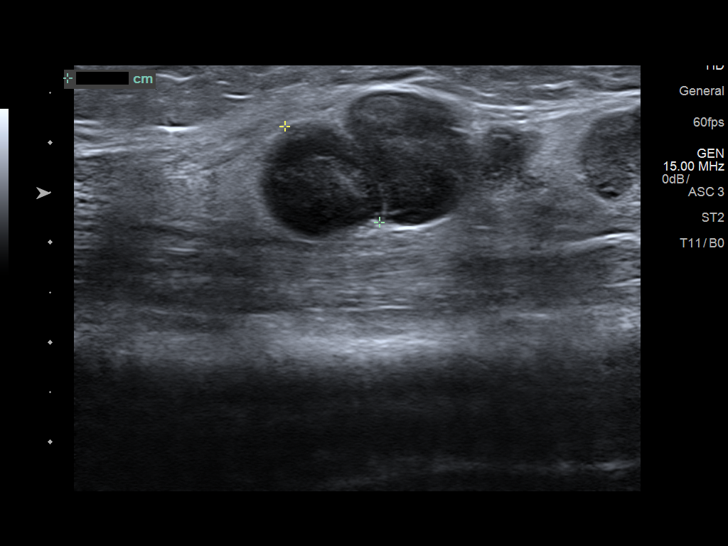
[im 11/22]
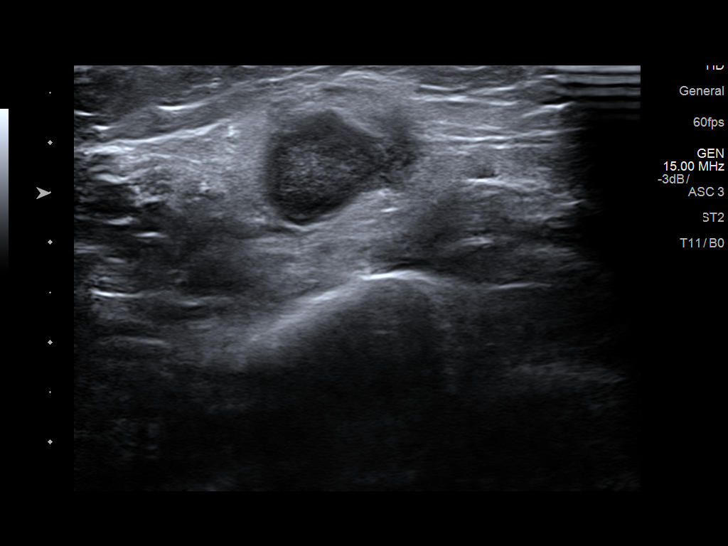
[im 12/22]
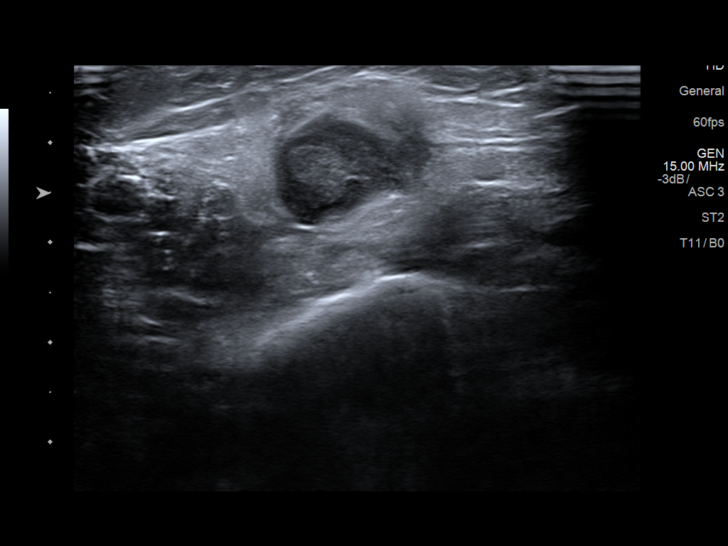
[im 14/22]
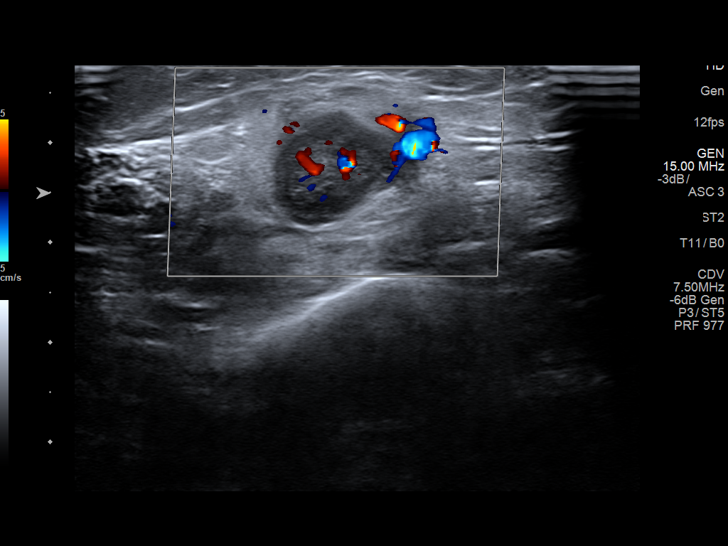
[im 15/22]
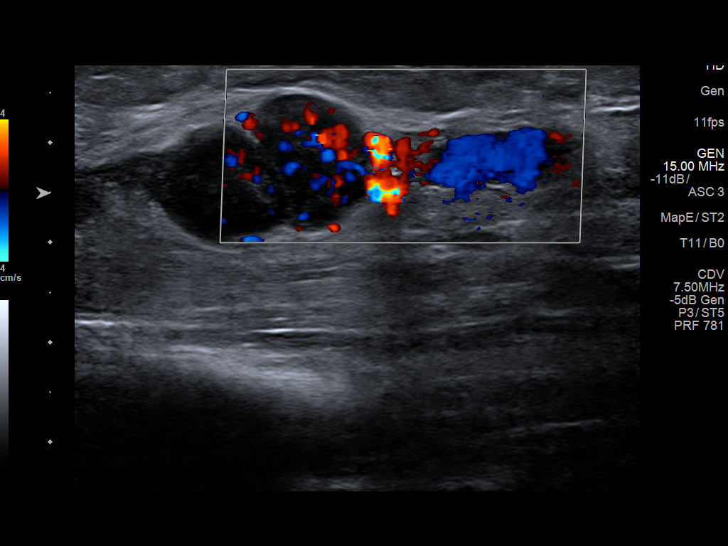
[im 17/22]
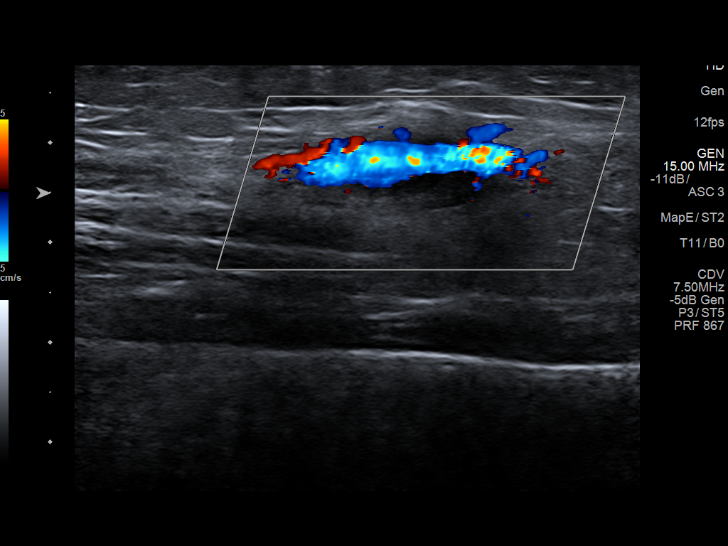
[im 19/22]
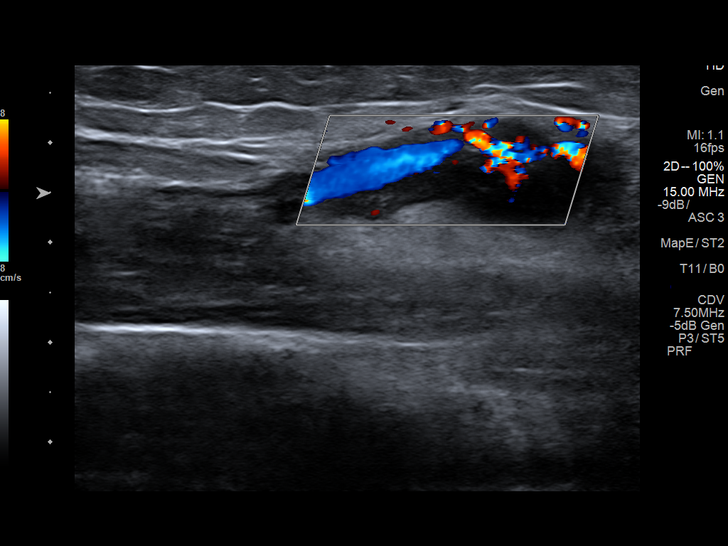
[im 20/22]
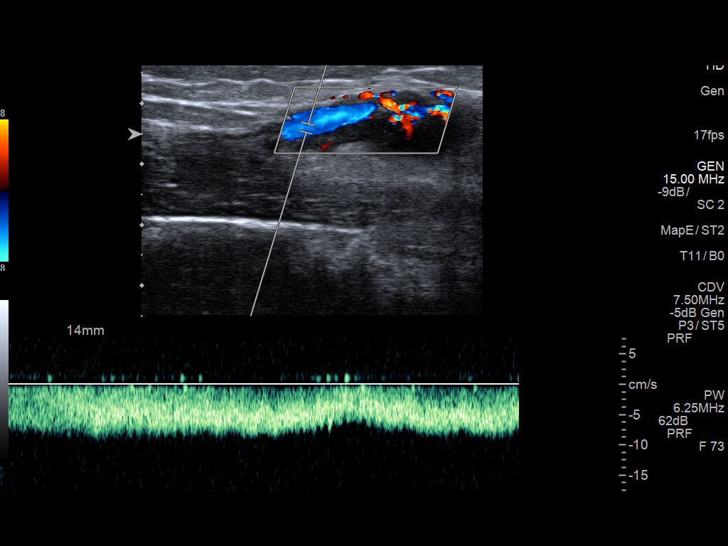
[im 22/22]
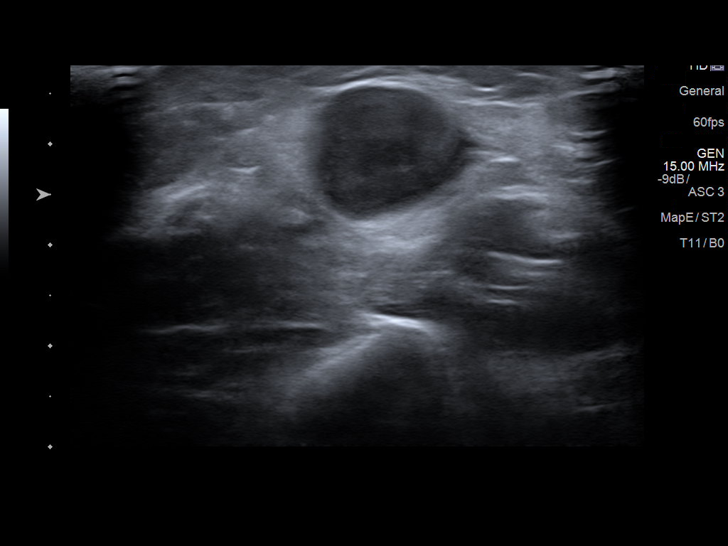

[14 of 22 positions shown; findings below may reference images not displayed]

FINDINGS: Two hypoechoic masses in the medial right upper arm with increased
vascularity. The smaller mass measures 10 x 12 mm. The larger mass
measures 13 x 14 mm.

No other soft tissue mass, fluid collection or hematoma. No
architectural distortion.
IMPRESSION: Two hypoechoic masses in the medial right upper arm with increased
vascularity most consistent with right brachial lymphadenitis likely
secondary to an infectious or inflammatory etiology.

## 2020-01-27 DIAGNOSIS — N760 Acute vaginitis: Secondary | ICD-10-CM | POA: Diagnosis not present

## 2020-01-27 DIAGNOSIS — Z118 Encounter for screening for other infectious and parasitic diseases: Secondary | ICD-10-CM | POA: Diagnosis not present

## 2020-01-27 DIAGNOSIS — N898 Other specified noninflammatory disorders of vagina: Secondary | ICD-10-CM | POA: Diagnosis not present

## 2020-01-27 DIAGNOSIS — Z113 Encounter for screening for infections with a predominantly sexual mode of transmission: Secondary | ICD-10-CM | POA: Diagnosis not present

## 2020-01-27 DIAGNOSIS — Z1159 Encounter for screening for other viral diseases: Secondary | ICD-10-CM | POA: Diagnosis not present

## 2020-01-27 DIAGNOSIS — Z114 Encounter for screening for human immunodeficiency virus [HIV]: Secondary | ICD-10-CM | POA: Diagnosis not present

## 2021-01-15 ENCOUNTER — Encounter (HOSPITAL_COMMUNITY): Payer: Self-pay | Admitting: Emergency Medicine

## 2021-01-15 ENCOUNTER — Emergency Department (HOSPITAL_COMMUNITY)
Admission: EM | Admit: 2021-01-15 | Discharge: 2021-01-16 | Disposition: A | Discharge status: Home / Self Care | Payer: BC Managed Care – PPO | Attending: Emergency Medicine | Admitting: Emergency Medicine

## 2021-01-15 ENCOUNTER — Emergency Department (HOSPITAL_COMMUNITY): Payer: BC Managed Care – PPO

## 2021-01-15 ENCOUNTER — Other Ambulatory Visit: Payer: Self-pay

## 2021-01-15 DIAGNOSIS — S20212A Contusion of left front wall of thorax, initial encounter: Secondary | ICD-10-CM

## 2021-01-15 DIAGNOSIS — S299XXA Unspecified injury of thorax, initial encounter: Secondary | ICD-10-CM | POA: Diagnosis present

## 2021-01-15 DIAGNOSIS — M7918 Myalgia, other site: Secondary | ICD-10-CM

## 2021-01-15 DIAGNOSIS — Y92410 Unspecified street and highway as the place of occurrence of the external cause: Secondary | ICD-10-CM | POA: Insufficient documentation

## 2021-01-15 DIAGNOSIS — M25512 Pain in left shoulder: Secondary | ICD-10-CM | POA: Insufficient documentation

## 2021-01-15 DIAGNOSIS — S20219A Contusion of unspecified front wall of thorax, initial encounter: Secondary | ICD-10-CM | POA: Insufficient documentation

## 2021-01-15 DIAGNOSIS — Z87891 Personal history of nicotine dependence: Secondary | ICD-10-CM | POA: Insufficient documentation
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
## 2021-01-16 MED ORDER — CYCLOBENZAPRINE HCL 10 MG PO TABS: 10.0000 mg | ORAL_TABLET | Freq: Two times a day (BID) | ORAL | 0 refills | Status: AC | PRN

## 2021-01-16 MED ORDER — IBUPROFEN 600 MG PO TABS: 600.0000 mg | ORAL_TABLET | Freq: Four times a day (QID) | ORAL | 0 refills | Status: AC | PRN
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
## 2021-01-16 NOTE — Discharge Instructions (Addendum)
Take the medications as prescribed (a muscle relaxer and ibuprofen) for pain and musculoskeletal bruising. Cool compresses to the sore areas.   Return to the ED with any severe pain, difficulty breathing, shortness of breath or for new concern.

## 2021-03-02 ENCOUNTER — Encounter (HOSPITAL_BASED_OUTPATIENT_CLINIC_OR_DEPARTMENT_OTHER): Payer: Self-pay | Admitting: Obstetrics and Gynecology

## 2021-03-02 ENCOUNTER — Emergency Department (HOSPITAL_BASED_OUTPATIENT_CLINIC_OR_DEPARTMENT_OTHER)
Admission: EM | Admit: 2021-03-02 | Discharge: 2021-03-02 | Disposition: A | Discharge status: Home / Self Care | Payer: BC Managed Care – PPO | Attending: Emergency Medicine | Admitting: Emergency Medicine

## 2021-03-02 ENCOUNTER — Other Ambulatory Visit: Payer: Self-pay

## 2021-03-02 DIAGNOSIS — H00014 Hordeolum externum left upper eyelid: Secondary | ICD-10-CM | POA: Insufficient documentation

## 2021-03-02 DIAGNOSIS — Z87891 Personal history of nicotine dependence: Secondary | ICD-10-CM | POA: Diagnosis not present

## 2021-03-02 DIAGNOSIS — H5712 Ocular pain, left eye: Secondary | ICD-10-CM | POA: Diagnosis present

## 2021-03-02 NOTE — ED Provider Notes (Signed)
[facility] EMERGENCY DEPARTMENT Provider Note  CSN: [id number] Arrival date & time: 03/02/21 1134    History Chief Complaint  Patient presents with   Eye Pain    Mandy Robinson is a 35 y.o. female with no significant PMH reports itching and swelling to left upper eyelid for the last few days. No blurry vision or drainage. She has been rubbing it. No FB sensation.    Past Medical History:  Diagnosis Date   No pertinent past medical history     Past Surgical History:  Procedure Laterality Date   DILATION AND CURETTAGE OF UTERUS      No family history on file.  Social History   Tobacco Use   Smoking status: Former    Packs/day: 0.25    Types: Cigarettes   Smokeless tobacco: Never  Vaping Use   Vaping Use: Some days   Substances: Nicotine, Flavoring  Substance Use Topics   Alcohol use: Yes    Comment: Social   Drug use: Yes    Types: Marijuana     Home Medications Prior to Admission medications   Medication Sig Start Date End Date Taking? Authorizing Provider  cyclobenzaprine (FLEXERIL) 10 MG tablet Take 1 tablet (10 mg total) by mouth 2 (two) times daily as needed for muscle spasms. 01/16/21   Elpidio Anis, PA-C  ibuprofen (ADVIL) 600 MG tablet Take 1 tablet (600 mg total) by mouth every 6 (six) hours as needed. 01/16/21   Elpidio Anis, PA-C     Allergies    Patient has no known allergies.   Review of Systems   Review of Systems  Eyes:  Positive for redness and itching. Negative for discharge and visual disturbance.    Physical Exam BP 127/64 (BP Location: Right Arm)   Pulse 66   Temp 98.2 F (36.8 C) (Oral)   Resp 15   Ht 5\' 8"  (1.727 m)   Wt 65.8 kg   LMP 02/10/2021 (Exact Date)   SpO2 99%   BMI 22.05 kg/m   Physical Exam Vitals and nursing note reviewed.  HENT:     Head: Normocephalic.     Nose: Nose normal.  Eyes:     Extraocular Movements: Extraocular movements intact.     Comments: Normal conjunctiva and anterior  chamber is clear on L. There is mild erythema and induration to L upper lid without discrete papule  Pulmonary:     Effort: Pulmonary effort is normal.  Musculoskeletal:        General: Normal range of motion.     Cervical back: Neck supple.  Skin:    Findings: No rash (on exposed skin).  Neurological:     Mental Status: She is alert and oriented to person, place, and time.  Psychiatric:        Mood and Affect: Mood normal.     ED Results / Procedures / Treatments   Labs (all labs ordered are listed, but only abnormal results are displayed) Labs Reviewed - No data to display  EKG None   Radiology No results found.  Procedures Procedures  Medications Ordered in the ED Medications - No data to display   MDM Rules/Calculators/A&P MDM  Possible early stye. Recommend warm compress, antibiotic ointment, benadryl for itching and follow up with Ophtho if now improving.  ED Course  I have reviewed the triage vital signs and the nursing notes.  Pertinent labs & imaging results that were available during my care of the patient were reviewed by me  and considered in my medical decision making (see chart for details).     Final Clinical Impression(s) / ED Diagnoses Final diagnoses:  Hordeolum externum of left upper eyelid    Rx / DC Orders ED Discharge Orders     None        Pollyann Savoy, MD 03/02/21 1316

## 2021-03-02 NOTE — ED Triage Notes (Addendum)
Patient reports to the ER for left eye swelling. Denies blurred vision, endorses itching. Started saturday

## 2021-03-09 ENCOUNTER — Encounter (HOSPITAL_BASED_OUTPATIENT_CLINIC_OR_DEPARTMENT_OTHER): Payer: Self-pay | Admitting: Obstetrics and Gynecology

## 2021-03-09 ENCOUNTER — Other Ambulatory Visit: Payer: Self-pay

## 2021-03-09 ENCOUNTER — Emergency Department (HOSPITAL_BASED_OUTPATIENT_CLINIC_OR_DEPARTMENT_OTHER)
Admission: EM | Admit: 2021-03-09 | Discharge: 2021-03-09 | Disposition: A | Discharge status: Home / Self Care | Payer: BC Managed Care – PPO | Attending: Emergency Medicine | Admitting: Emergency Medicine

## 2021-03-09 DIAGNOSIS — R112 Nausea with vomiting, unspecified: Secondary | ICD-10-CM | POA: Diagnosis not present

## 2021-03-09 DIAGNOSIS — K921 Melena: Secondary | ICD-10-CM | POA: Diagnosis not present

## 2021-03-09 DIAGNOSIS — Z5321 Procedure and treatment not carried out due to patient leaving prior to being seen by health care provider: Secondary | ICD-10-CM | POA: Insufficient documentation

## 2021-03-09 DIAGNOSIS — R109 Unspecified abdominal pain: Secondary | ICD-10-CM | POA: Insufficient documentation

## 2021-03-09 DIAGNOSIS — R52 Pain, unspecified: Secondary | ICD-10-CM

## 2021-03-09 LAB — COMPREHENSIVE METABOLIC PANEL
ALT: 9 U/L (ref 0–44)
AST: 11 U/L — ABNORMAL LOW (ref 15–41)
Albumin: 4.2 g/dL (ref 3.5–5.0)
Alkaline Phosphatase: 39 U/L (ref 38–126)
Anion gap: 8 (ref 5–15)
BUN: 9 mg/dL (ref 6–20)
CO2: 23 mmol/L (ref 22–32)
Calcium: 9.1 mg/dL (ref 8.9–10.3)
Chloride: 106 mmol/L (ref 98–111)
Creatinine, Ser: 0.57 mg/dL (ref 0.44–1.00)
GFR, Estimated: >60 mL/min (ref >60)
Glucose, Bld: 87 mg/dL (ref 70–99)
Potassium: 3.7 mmol/L (ref 3.5–5.1)
Sodium: 137 mmol/L (ref 135–145)
Total Bilirubin: 0.4 mg/dL (ref 0.3–1.2)
Total Protein: 7.1 g/dL (ref 6.5–8.1)

## 2021-03-09 LAB — CBC
HCT: 40.8 % (ref 36.0–46.0)
Hemoglobin: 13.8 g/dL (ref 12.0–15.0)
MCH: 31.5 pg (ref 26.0–34.0)
MCHC: 33.8 g/dL (ref 30.0–36.0)
MCV: 93.2 fL (ref 80.0–100.0)
Platelets: 292 10*3/uL (ref 150–400)
RBC: 4.38 MIL/uL (ref 3.87–5.11)
RDW: 12.9 % (ref 11.5–15.5)
WBC: 6.2 10*3/uL (ref 4.0–10.5)
nRBC: 0 % (ref 0.0–0.2)

## 2021-03-09 LAB — URINALYSIS, ROUTINE W REFLEX MICROSCOPIC
Bilirubin Urine: NEGATIVE
Glucose, UA: NEGATIVE mg/dL
Hgb urine dipstick: NEGATIVE
Ketones, ur: 40 mg/dL — AB
Leukocytes,Ua: NEGATIVE
Nitrite: NEGATIVE
Protein, ur: NEGATIVE mg/dL
Specific Gravity, Urine: 1.022 (ref 1.005–1.030)
pH: 5.5 (ref 5.0–8.0)

## 2021-03-09 LAB — PREGNANCY, URINE: Preg Test, Ur: NEGATIVE

## 2021-03-09 LAB — LIPASE, BLOOD: Lipase: 10 U/L — ABNORMAL LOW (ref 11–51)

## 2021-03-09 MED ORDER — ONDANSETRON 4 MG PO TBDP
4.0000 mg | ORAL_TABLET | Freq: Once | ORAL | Status: AC | PRN
Start: 2021-03-09 — End: 2021-03-09
  Administered 2021-03-09: 4 mg via ORAL
  Filled 2021-03-09: qty 1

## 2021-03-09 NOTE — ED Notes (Signed)
Pt called again.  No answer. 

## 2021-03-09 NOTE — ED Notes (Signed)
Called patient in lobby to check vitals but no answer

## 2021-03-09 NOTE — ED Triage Notes (Signed)
Patient reports to the ER for nausea and abdominal discomfort. Patient reports frequent emesis and blood in stool

## 2023-11-23 ENCOUNTER — Other Ambulatory Visit (HOSPITAL_COMMUNITY): Payer: Self-pay

## 2024-03-19 ENCOUNTER — Other Ambulatory Visit (HOSPITAL_COMMUNITY): Payer: Self-pay

## 2024-04-27 ENCOUNTER — Ambulatory Visit
Admission: EM | Admit: 2024-04-27 | Discharge: 2024-04-27 | Disposition: A | Discharge status: Home / Self Care | Payer: Self-pay | Source: Ambulatory Visit | Attending: Family Medicine | Admitting: Family Medicine

## 2024-04-27 DIAGNOSIS — Z113 Encounter for screening for infections with a predominantly sexual mode of transmission: Secondary | ICD-10-CM | POA: Insufficient documentation

## 2024-04-27 DIAGNOSIS — N898 Other specified noninflammatory disorders of vagina: Secondary | ICD-10-CM | POA: Insufficient documentation

## 2024-04-27 MED ORDER — METRONIDAZOLE 500 MG PO TABS: 500.0000 mg | ORAL_TABLET | Freq: Two times a day (BID) | ORAL | 0 refills | Status: AC

## 2024-04-27 NOTE — ED Provider Notes (Signed)
 [facility]    CSN: [id number] Arrival date & time: 04/27/24  1427      History   Chief Complaint Chief Complaint  Patient presents with   SEXUALLY TRANSMITTED DISEASE    HPI Mandy Robinson is a 38 y.o. female presents for STD testing and vaginal discharge.  Patient reports 1 week of a watery malodorous vaginal discharge that is not pruritic.  Denies any dysuria, rashes, fevers, vomiting or flank pain.  No known STD exposure but would like screening.  She does use boric acid suppositories.  No other concerns at this time.  HPI  Past Medical History:  Diagnosis Date   No pertinent past medical history     There are no active problems to display for this patient.   Past Surgical History:  Procedure Laterality Date   DILATION AND CURETTAGE OF UTERUS      OB History     Gravida  3   Para  2   Term  1   Preterm  1   AB  1   Living  2      SAB  0   IAB  1   Ectopic  0   Multiple  0   Live Births  1            Home Medications    Prior to Admission medications   Medication Sig Start Date End Date Taking? Authorizing Provider  metroNIDAZOLE  (FLAGYL ) 500 MG tablet Take 1 tablet (500 mg total) by mouth 2 (two) times daily. 04/27/24  Yes Abriel Hattery, Jodi R, NP  cyclobenzaprine  (FLEXERIL ) 10 MG tablet Take 1 tablet (10 mg total) by mouth 2 (two) times daily as needed for muscle spasms. 01/16/21   Odell Balls, PA-C  ibuprofen  (ADVIL ) 600 MG tablet Take 1 tablet (600 mg total) by mouth every 6 (six) hours as needed. 01/16/21   Odell Balls, PA-C    Family History History reviewed. No pertinent family history.  Social History Social History   Tobacco Use   Smoking status: Former    Current packs/day: 0.25    Types: Cigarettes   Smokeless tobacco: Never  Vaping Use   Vaping status: Some Days   Substances: Nicotine, Flavoring  Substance Use Topics   Alcohol use: Yes    Comment: Social   Drug use: Yes    Types: Marijuana      Allergies   Patient has no known allergies.   Review of Systems Review of Systems  Genitourinary:        STD screening and vaginal discharge     Physical Exam Triage Vital Signs ED Triage Vitals  Encounter Vitals Group     BP 04/27/24 1513 114/80     Girls Systolic BP Percentile --      Girls Diastolic BP Percentile --      Boys Systolic BP Percentile --      Boys Diastolic BP Percentile --      Pulse Rate 04/27/24 1512 72     Resp 04/27/24 1512 18     Temp 04/27/24 1512 98.9 F (37.2 C)     Temp Source 04/27/24 1512 Oral     SpO2 04/27/24 1512 98 %     Weight --      Height --      Head Circumference --      Peak Flow --      Pain Score 04/27/24 1510 0     Pain Loc --  Pain Education --      Exclude from Growth Chart --    No data found.  Updated Vital Signs BP 114/80   Pulse 72   Temp 98.9 F (37.2 C) (Oral)   Resp 18   LMP 04/05/2024 (Approximate)   SpO2 98%   Visual Acuity Right Eye Distance:   Left Eye Distance:   Bilateral Distance:    Right Eye Near:   Left Eye Near:    Bilateral Near:     Physical Exam Vitals and nursing note reviewed.  Constitutional:      Appearance: Normal appearance.  HENT:     Head: Normocephalic and atraumatic.  Eyes:     Pupils: Pupils are equal, round, and reactive to light.  Cardiovascular:     Rate and Rhythm: Normal rate.  Pulmonary:     Effort: Pulmonary effort is normal.  Skin:    General: Skin is warm and dry.  Neurological:     General: No focal deficit present.     Mental Status: She is alert and oriented to person, place, and time.  Psychiatric:        Mood and Affect: Mood normal.        Behavior: Behavior normal.      UC Treatments / Results  Labs (all labs ordered are listed, but only abnormal results are displayed) Labs Reviewed  RPR  HIV ANTIBODY (ROUTINE TESTING W REFLEX)  CERVICOVAGINAL ANCILLARY ONLY    EKG   Radiology No results found.  Procedures Procedures  (including critical care time)  Medications Ordered in UC Medications - No data to display  Initial Impression / Assessment and Plan / UC Course  I have reviewed the triage vital signs and the nursing notes.  Pertinent labs & imaging results that were available during my care of the patient were reviewed by me and considered in my medical decision making (see chart for details).     Reviewed exam and symptoms with patient.  Vaginal swab/STD testing is ordered and will contact for any positive results.  Given BV symptoms start metronidazole  twice daily for 7 days.  Advised PCP or GYN follow-up if symptoms do not improve.  ER precautions reviewed. Final Clinical Impressions(s) / UC Diagnoses   Final diagnoses:  Screening examination for STD (sexually transmitted disease)  Vaginal discharge     Discharge Instructions      The clinic will contact you with results of the STD testing done today if positive.  Start metronidazole  for your BV symptoms.  Please follow-up with your PCP or gynecologist if your symptoms do not improve.  Please go to the ER for any worsening symptoms.  Hope you feel better soon!    ED Prescriptions     Medication Sig Dispense Auth. Provider   metroNIDAZOLE  (FLAGYL ) 500 MG tablet Take 1 tablet (500 mg total) by mouth 2 (two) times daily. 14 tablet Jordana Dugue, Jodi R, NP      PDMP not reviewed this encounter.   Loreda Myla SAUNDERS, NP 04/27/24 [phone]

## 2024-04-27 NOTE — Discharge Instructions (Addendum)
 The clinic will contact you with results of the STD testing done today if positive.  Start metronidazole  for your BV symptoms.  Please follow-up with your PCP or gynecologist if your symptoms do not improve.  Please go to the ER for any worsening symptoms.  Hope you feel better soon!

## 2024-04-27 NOTE — ED Triage Notes (Signed)
 Pt present for STD testing. States she has vaginal discharge x one week. Pt denies pains and other symptoms. Pt denies exposure.

## 2024-04-28 ENCOUNTER — Ambulatory Visit (HOSPITAL_COMMUNITY): Payer: Self-pay

## 2024-04-28 LAB — CERVICOVAGINAL ANCILLARY ONLY
Bacterial Vaginitis (gardnerella): POSITIVE — AB
Candida Glabrata: NEGATIVE
Candida Vaginitis: NEGATIVE
Chlamydia: POSITIVE — AB
Comment: NEGATIVE
Comment: NEGATIVE
Comment: NEGATIVE
Comment: NEGATIVE
Comment: NEGATIVE
Comment: NORMAL
Neisseria Gonorrhea: NEGATIVE
Trichomonas: NEGATIVE

## 2024-04-28 MED ORDER — DOXYCYCLINE HYCLATE 100 MG PO TABS: 100.0000 mg | ORAL_TABLET | Freq: Two times a day (BID) | ORAL | 0 refills | Status: AC

## 2024-04-29 LAB — HIV ANTIBODY (ROUTINE TESTING W REFLEX): HIV Screen 4th Generation wRfx: NONREACTIVE

## 2024-04-29 LAB — RPR: RPR Ser Ql: NONREACTIVE

## 2024-06-30 ENCOUNTER — Other Ambulatory Visit (HOSPITAL_COMMUNITY): Payer: Self-pay

## 2024-07-12 ENCOUNTER — Other Ambulatory Visit (HOSPITAL_COMMUNITY): Payer: Self-pay
